# Patient Record
Sex: Female | Born: 1990 | Race: White | Hispanic: No | Marital: Single | Smoking: Never smoker
Health system: Southern US, Community
[De-identification: ages and names within clinical notes are randomized; demographics above are authoritative.]

## PROBLEM LIST (undated history)

## (undated) DIAGNOSIS — R011 Cardiac murmur, unspecified: Secondary | ICD-10-CM

## (undated) DIAGNOSIS — J45909 Unspecified asthma, uncomplicated: Secondary | ICD-10-CM

## (undated) DIAGNOSIS — J302 Other seasonal allergic rhinitis: Secondary | ICD-10-CM

## (undated) DIAGNOSIS — K219 Gastro-esophageal reflux disease without esophagitis: Secondary | ICD-10-CM

## (undated) DIAGNOSIS — K819 Cholecystitis, unspecified: Secondary | ICD-10-CM

## (undated) DIAGNOSIS — IMO0001 Reserved for inherently not codable concepts without codable children: Secondary | ICD-10-CM

## (undated) HISTORY — PX: EYE SURGERY: SHX253

---

## 2014-10-19 ENCOUNTER — Ambulatory Visit: Payer: Self-pay | Admitting: Family Medicine

## 2014-11-05 ENCOUNTER — Emergency Department: Payer: Self-pay | Admitting: Emergency Medicine

## 2014-11-05 LAB — CBC WITH DIFFERENTIAL/PLATELET
BASOS PCT: 1.1 %
Basophil #: 0.1 10*3/uL (ref 0.0–0.1)
EOS ABS: 0.4 10*3/uL (ref 0.0–0.7)
Eosinophil %: 3.9 %
HCT: 39.5 % (ref 35.0–47.0)
HGB: 12.7 g/dL (ref 12.0–16.0)
Lymphocyte #: 3.4 10*3/uL (ref 1.0–3.6)
Lymphocyte %: 34.3 %
MCH: 27.3 pg (ref 26.0–34.0)
MCHC: 32 g/dL (ref 32.0–36.0)
MCV: 85 fL (ref 80–100)
Monocyte #: 0.7 x10 3/mm (ref 0.2–0.9)
Monocyte %: 6.7 %
NEUTROS PCT: 54 %
Neutrophil #: 5.4 10*3/uL (ref 1.4–6.5)
Platelet: 262 10*3/uL (ref 150–440)
RBC: 4.63 10*6/uL (ref 3.80–5.20)
RDW: 16.5 % — AB (ref 11.5–14.5)
WBC: 9.9 10*3/uL (ref 3.6–11.0)

## 2014-11-05 LAB — URINALYSIS, COMPLETE
BILIRUBIN, UR: NEGATIVE
Blood: NEGATIVE
GLUCOSE, UR: NEGATIVE mg/dL (ref 0–75)
Ketone: NEGATIVE
NITRITE: NEGATIVE
Ph: 5 (ref 4.5–8.0)
Protein: 30
RBC,UR: <1 /HPF (ref 0–5)
Specific Gravity: 1.033 (ref 1.003–1.030)
WBC UR: 2 /HPF (ref 0–5)

## 2014-11-05 LAB — COMPREHENSIVE METABOLIC PANEL
ALK PHOS: 60 U/L
ALT: 18 U/L
AST: 19 U/L (ref 15–37)
Albumin: 3.6 g/dL (ref 3.4–5.0)
Anion Gap: 5 — ABNORMAL LOW (ref 7–16)
BILIRUBIN TOTAL: 0.3 mg/dL (ref 0.2–1.0)
BUN: 19 mg/dL — ABNORMAL HIGH (ref 7–18)
CALCIUM: 9 mg/dL (ref 8.5–10.1)
CHLORIDE: 111 mmol/L — AB (ref 98–107)
Co2: 24 mmol/L (ref 21–32)
Creatinine: 0.68 mg/dL (ref 0.60–1.30)
EGFR (African American): >60
Glucose: 92 mg/dL (ref 65–99)
OSMOLALITY: 281 (ref 275–301)
POTASSIUM: 4.4 mmol/L (ref 3.5–5.1)
Sodium: 140 mmol/L (ref 136–145)
Total Protein: 7.7 g/dL (ref 6.4–8.2)

## 2014-11-05 LAB — PREGNANCY, URINE: Pregnancy Test, Urine: NEGATIVE m[IU]/mL

## 2015-08-13 ENCOUNTER — Encounter: Payer: Self-pay | Admitting: *Deleted

## 2015-08-16 ENCOUNTER — Ambulatory Visit
Admission: RE | Admit: 2015-08-16 | Discharge: 2015-08-16 | Disposition: A | Discharge status: Home / Self Care | Payer: Medicaid Other | Source: Ambulatory Visit | Attending: Gastroenterology | Admitting: Gastroenterology

## 2015-08-16 ENCOUNTER — Encounter: Payer: Self-pay | Admitting: *Deleted

## 2015-08-16 ENCOUNTER — Encounter
Admission: RE | Disposition: A | Discharge status: Home / Self Care | Payer: Self-pay | Source: Ambulatory Visit | Attending: Gastroenterology

## 2015-08-16 ENCOUNTER — Ambulatory Visit: Payer: Medicaid Other | Admitting: Certified Registered Nurse Anesthetist

## 2015-08-16 DIAGNOSIS — Z8711 Personal history of peptic ulcer disease: Secondary | ICD-10-CM | POA: Insufficient documentation

## 2015-08-16 DIAGNOSIS — Z79899 Other long term (current) drug therapy: Secondary | ICD-10-CM | POA: Insufficient documentation

## 2015-08-16 DIAGNOSIS — R112 Nausea with vomiting, unspecified: Secondary | ICD-10-CM | POA: Insufficient documentation

## 2015-08-16 DIAGNOSIS — B9681 Helicobacter pylori [H. pylori] as the cause of diseases classified elsewhere: Secondary | ICD-10-CM | POA: Diagnosis not present

## 2015-08-16 DIAGNOSIS — Z7951 Long term (current) use of inhaled steroids: Secondary | ICD-10-CM | POA: Diagnosis not present

## 2015-08-16 DIAGNOSIS — J45909 Unspecified asthma, uncomplicated: Secondary | ICD-10-CM | POA: Diagnosis not present

## 2015-08-16 DIAGNOSIS — K219 Gastro-esophageal reflux disease without esophagitis: Secondary | ICD-10-CM | POA: Insufficient documentation

## 2015-08-16 DIAGNOSIS — K2950 Unspecified chronic gastritis without bleeding: Secondary | ICD-10-CM | POA: Insufficient documentation

## 2015-08-16 HISTORY — PX: ESOPHAGOGASTRODUODENOSCOPY (EGD) WITH PROPOFOL: SHX5813

## 2015-08-16 HISTORY — DX: Unspecified asthma, uncomplicated: J45.909

## 2015-08-16 LAB — POCT PREGNANCY, URINE: Preg Test, Ur: NEGATIVE

## 2015-08-16 SURGERY — ESOPHAGOGASTRODUODENOSCOPY (EGD) WITH PROPOFOL
Anesthesia: General

## 2015-08-16 MED ORDER — LIDOCAINE HCL (CARDIAC) 20 MG/ML IV SOLN
INTRAVENOUS | Status: DC | PRN
  Administered 2015-08-16: 100 mg via INTRAVENOUS

## 2015-08-16 MED ORDER — PROPOFOL 500 MG/50ML IV EMUL
INTRAVENOUS | Status: DC | PRN
  Administered 2015-08-16: 150 ug/kg/min via INTRAVENOUS

## 2015-08-16 MED ORDER — MIDAZOLAM HCL 2 MG/2ML IJ SOLN
INTRAMUSCULAR | Status: DC | PRN
  Administered 2015-08-16: 1 mg via INTRAVENOUS

## 2015-08-16 MED ORDER — FENTANYL CITRATE (PF) 100 MCG/2ML IJ SOLN
INTRAMUSCULAR | Status: DC | PRN
  Administered 2015-08-16: 50 ug via INTRAVENOUS

## 2015-08-16 MED ORDER — IPRATROPIUM-ALBUTEROL 0.5-2.5 (3) MG/3ML IN SOLN
RESPIRATORY_TRACT | Status: AC
  Filled 2015-08-16: qty 3

## 2015-08-16 MED ORDER — IPRATROPIUM-ALBUTEROL 0.5-2.5 (3) MG/3ML IN SOLN
3.0000 mL | Freq: Once | RESPIRATORY_TRACT | Status: AC
  Administered 2015-08-16: 3 mL via RESPIRATORY_TRACT

## 2015-08-16 MED ORDER — SODIUM CHLORIDE 0.9 % IV SOLN
INTRAVENOUS | Status: DC
  Administered 2015-08-16: 11:00:00 via INTRAVENOUS

## 2015-08-16 MED ORDER — PROPOFOL 10 MG/ML IV BOLUS
INTRAVENOUS | Status: DC | PRN
  Administered 2015-08-16: 30 mg via INTRAVENOUS

## 2015-08-16 MED ORDER — GLYCOPYRROLATE 0.2 MG/ML IJ SOLN
INTRAMUSCULAR | Status: DC | PRN
  Administered 2015-08-16: 0.2 mg via INTRAVENOUS

## 2015-08-16 NOTE — Anesthesia Procedure Notes (Signed)
Performed by: Lilliona Blakeney Pre-anesthesia Checklist: Patient identified, Emergency Drugs available, Suction available, Patient being monitored and Timeout performed Patient Re-evaluated:Patient Re-evaluated prior to inductionOxygen Delivery Method: Nasal cannula Intubation Type: IV induction       

## 2015-08-16 NOTE — Transfer of Care (Signed)
Immediate Anesthesia Transfer of Care Note  Patient: Rhonda Curry  Procedure(s) Performed: Procedure(s): ESOPHAGOGASTRODUODENOSCOPY (EGD) WITH PROPOFOL (N/A)  Patient Location: PACU  Anesthesia Type:General  Level of Consciousness: sedated  Airway & Oxygen Therapy: Patient Spontanous Breathing and Patient connected to nasal cannula oxygen  Post-op Assessment: Report given to RN and Post -op Vital signs reviewed and stable  Post vital signs: Reviewed and stable  Last Vitals:  Filed Vitals:   08/16/15 1022  BP: 140/91  Pulse: 90  Temp: 37.1 C  Resp: 10    Complications: No apparent anesthesia complications

## 2015-08-16 NOTE — Anesthesia Postprocedure Evaluation (Signed)
  Anesthesia Post-op Note  Patient: Rhonda Curry  Procedure(s) Performed: Procedure(s): ESOPHAGOGASTRODUODENOSCOPY (EGD) WITH PROPOFOL (N/A)  Anesthesia type:General  Patient location: PACU  Post pain: Pain level controlled  Post assessment: Post-op Vital signs reviewed, Patient's Cardiovascular Status Stable, Respiratory Function Stable, Patent Airway and No signs of Nausea or vomiting  Post vital signs: Reviewed and stable  Last Vitals:  Filed Vitals:   08/16/15 1220  BP: 114/78  Pulse: 60  Temp:   Resp: 15    Level of consciousness: awake, alert  and patient cooperative  Complications: No apparent anesthesia complications

## 2015-08-16 NOTE — Op Note (Signed)
[facility] Patient Name: Rhonda Curry Procedure Date: 08/16/2015 11:22 AM MRN: [id number] Account #: [id number] Date of Birth: 31-Dec-1990 Admit Type: Outpatient Age: 24 Room: [facility] ENDO ROOM 4 Gender: Female Note Status: Finalized Procedure:         Upper GI endoscopy Indications:       Epigastric abdominal pain, Dyspepsia Providers:         Christena Deem, MD Referring MD:      Charisse March (Referring MD) Medicines:         Monitored Anesthesia Care Complications:     No immediate complications. Procedure:         Pre-Anesthesia Assessment:                    - ASA Grade Assessment: II - A patient with mild systemic                     disease.                    After obtaining informed consent, the endoscope was passed                     under direct vision. Throughout the procedure, the                     patient's blood pressure, pulse, and oxygen saturations                     were monitored continuously. The Endoscope was introduced                     through the mouth, and advanced to the third part of                     duodenum. The upper GI endoscopy was accomplished without                     difficulty. The patient tolerated the procedure well. Findings:      The Z-line was variable. Biopsies were taken with a cold forceps for       histology.      Diffuse mild inflammation characterized by congestion (edema) and       erythema was found in the gastric body and in the gastric antrum.       Biopsies were taken with a cold forceps for histology.      patulous ge junction      The examined duodenum was normal. Impression:        - Z-line variable. Biopsied.                    - Bile gastritis. Biopsied.                    - Normal examined duodenum. Recommendation:    - Use Protonix (pantoprazole) 40 mg PO daily daily.                    - Use sucralfate tablets 1 gram PO QID daily.                    - Return  to GI clinic in 1 month.                    - Await pathology results.  Procedure Code(s): --- Professional ---                    [phone], Esophagogastroduodenoscopy, flexible, transoral;                     with biopsy, single or multiple Diagnosis Code(s): --- Professional ---                    530.89, Other specified disorders of esophagus                    535.40, Other specified gastritis, without mention of                     hemorrhage                    789.06, Abdominal pain, epigastric                    536.8, Dyspepsia and other specified disorders of function                     of stomach CPT copyright 2014 American [employer] Association. All rights reserved. The codes documented in this report are preliminary and upon coder review may  be revised to meet current compliance requirements. Christena Deem, MD 08/16/2015 11:44:37 AM This report has been signed electronically. Number of Addenda: 0 Note Initiated On: 08/16/2015 11:22 AM      [facility]

## 2015-08-16 NOTE — Anesthesia Preprocedure Evaluation (Signed)
Anesthesia Evaluation  Patient identified by MRN, date of birth, ID band Patient awake    Reviewed: Allergy & Precautions, H&P , NPO status , Patient's Chart, lab work & pertinent test results  History of Anesthesia Complications Negative for: history of anesthetic complications  Airway Mallampati: II  TM Distance: >3 FB Neck ROM: full    Dental  (+) Poor Dentition, Chipped, Missing   Pulmonary neg shortness of breath, asthma ,    Pulmonary exam normal breath sounds clear to auscultation       Cardiovascular Exercise Tolerance: Good (-) Past MI negative cardio ROS Normal cardiovascular exam Rhythm:regular Rate:Normal     Neuro/Psych negative neurological ROS  negative psych ROS   GI/Hepatic Neg liver ROS, GERD  Controlled,  Endo/Other  negative endocrine ROS  Renal/GU negative Renal ROS  negative genitourinary   Musculoskeletal negative musculoskeletal ROS (+)   Abdominal   Peds negative pediatric ROS (+)  Hematology negative hematology ROS (+)   Anesthesia Other Findings Past Medical History:   Asthma                                                      BMI    Body Mass Index   29.68 kg/m 2      Reproductive/Obstetrics negative OB ROS                             Anesthesia Physical Anesthesia Plan  ASA: III  Anesthesia Plan: General   Post-op Pain Management:    Induction:   Airway Management Planned:   Additional Equipment:   Intra-op Plan:   Post-operative Plan:   Informed Consent: I have reviewed the patients History and Physical, chart, labs and discussed the procedure including the risks, benefits and alternatives for the proposed anesthesia with the patient or authorized representative who has indicated his/her understanding and acceptance.   Dental Advisory Given  Plan Discussed with: Anesthesiologist, CRNA and Surgeon  Anesthesia Plan Comments:          Anesthesia Quick Evaluation

## 2015-08-16 NOTE — H&P (Signed)
Outpatient short stay form Pre-procedure 08/16/2015 11:23 AM Christena Deem MD  Primary Physician: Dr. Lanier Ensign  Reason for visit:  EGD  History of present illness:  Patient is a 24 year old female with a personal history of ulcer as well as NSAID use. She is presenting today for an EGD in regards to refractory reflux as well as epigastric pain and nausea. He used to take ibuprofen but had stopped that however continues with meloxicam daily.    Current facility-administered medications:  .  0.9 %  sodium chloride infusion, , Intravenous, Continuous, Christena Deem, MD, Last Rate: 20 mL/hr at 08/16/15 1052 .  ipratropium-albuterol (DUONEB) 0.5-2.5 (3) MG/3ML nebulizer solution, , , ,   Facility-Administered Medications Ordered in Other Encounters:  .  fentaNYL (SUBLIMAZE) injection, , , Anesthesia Intra-op, Malva Cogan, CRNA, 50 mcg at 08/16/15 1122 .  glycopyrrolate (ROBINUL) injection, , , Anesthesia Intra-op, Malva Cogan, CRNA, 0.2 mg at 08/16/15 1120 .  midazolam (VERSED) injection, , , Anesthesia Intra-op, Malva Cogan, CRNA, 1 mg at 08/16/15 1122  Prescriptions prior to admission  Medication Sig Dispense Refill Last Dose  . albuterol (PROVENTIL HFA;VENTOLIN HFA) 108 (90 BASE) MCG/ACT inhaler Inhale into the lungs every 6 (six) hours as needed for wheezing or shortness of breath.   Past Month at Unknown time  . beclomethasone (QVAR) 40 MCG/ACT inhaler Inhale into the lungs 2 (two) times daily.   Past Month at Unknown time  . cetirizine (ZYRTEC) 10 MG tablet Take 10 mg by mouth daily.   Past Week at Unknown time  . chlorhexidine (PERIDEX) 0.12 % solution Use as directed 15 mLs in the mouth or throat 2 (two) times daily.   08/15/2015 at Unknown time  . fluticasone (FLONASE) 50 MCG/ACT nasal spray Place 2 sprays into both nostrils daily.   Past Week at Unknown time  . meloxicam (MOBIC) 7.5 MG tablet Take 7.5 mg by mouth daily.   Past Week at Unknown time  .  ondansetron (ZOFRAN-ODT) 4 MG disintegrating tablet Take 4 mg by mouth every 8 (eight) hours as needed for nausea or vomiting.   Past Week at Unknown time  . pantoprazole (PROTONIX) 40 MG tablet Take 40 mg by mouth daily.   Past Week at Unknown time  . ranitidine (ZANTAC) 150 MG capsule Take 150 mg by mouth daily.   Past Week at Unknown time     Allergies  Allergen Reactions  . Acetaminophen      Past Medical History  Diagnosis Date  . Asthma     Review of systems:      Physical Exam    Heart and lungs: Regular rate and rhythm without rub or gallop, lungs are bilaterally clear    HEENT: Normocephalic atraumatic eyes are anicteric    Other:     Pertinant exam for procedure: Soft nontender nondistended bowel sounds positive normoactive    Planned proceedures: EGD and indicated procedures I have discussed the risks benefits and complications of procedures to include not limited to bleeding, infection, perforation and the risk of sedation and the patient wishes to proceed.    Christena Deem, MD Gastroenterology 08/16/2015  11:23 AM

## 2015-08-18 LAB — SURGICAL PATHOLOGY

## 2015-08-25 ENCOUNTER — Encounter: Payer: Self-pay | Admitting: Gastroenterology

## 2015-10-26 ENCOUNTER — Other Ambulatory Visit: Payer: Self-pay | Admitting: Gastroenterology

## 2015-10-26 DIAGNOSIS — R1084 Generalized abdominal pain: Secondary | ICD-10-CM

## 2015-11-05 ENCOUNTER — Ambulatory Visit
Admission: RE | Admit: 2015-11-05 | Discharge: 2015-11-05 | Disposition: A | Discharge status: Home / Self Care | Payer: Medicaid Other | Source: Ambulatory Visit | Attending: Gastroenterology | Admitting: Gastroenterology

## 2015-11-05 ENCOUNTER — Encounter
Admission: RE | Admit: 2015-11-05 | Discharge: 2015-11-05 | Disposition: A | Discharge status: Home / Self Care | Payer: Medicaid Other | Source: Ambulatory Visit | Attending: Gastroenterology | Admitting: Gastroenterology

## 2015-11-05 ENCOUNTER — Other Ambulatory Visit
Admission: RE | Admit: 2015-11-05 | Discharge: 2015-11-05 | Disposition: A | Discharge status: Home / Self Care | Payer: Medicaid Other | Source: Ambulatory Visit | Attending: Gastroenterology | Admitting: Gastroenterology

## 2015-11-05 DIAGNOSIS — R1084 Generalized abdominal pain: Secondary | ICD-10-CM

## 2015-11-05 DIAGNOSIS — R1011 Right upper quadrant pain: Secondary | ICD-10-CM | POA: Insufficient documentation

## 2015-11-05 LAB — PREGNANCY, URINE: PREG TEST UR: NEGATIVE

## 2015-11-05 MED ORDER — SINCALIDE 5 MCG IJ SOLR
0.0200 ug/kg | Freq: Once | INTRAMUSCULAR | Status: AC
  Administered 2015-11-05: 1.44 ug via INTRAVENOUS

## 2015-11-05 MED ORDER — TECHNETIUM TC 99M MEBROFENIN IV KIT
5.0000 | PACK | Freq: Once | INTRAVENOUS | Status: AC | PRN
  Administered 2015-11-05: 5.05 via INTRAVENOUS

## 2015-11-26 ENCOUNTER — Encounter
Admission: RE | Admit: 2015-11-26 | Discharge: 2015-11-26 | Disposition: A | Discharge status: Home / Self Care | Payer: Medicaid Other | Source: Ambulatory Visit | Attending: Surgery | Admitting: Surgery

## 2015-11-26 DIAGNOSIS — Z01812 Encounter for preprocedural laboratory examination: Secondary | ICD-10-CM | POA: Insufficient documentation

## 2015-11-26 DIAGNOSIS — R011 Cardiac murmur, unspecified: Secondary | ICD-10-CM

## 2015-11-26 HISTORY — DX: Gastro-esophageal reflux disease without esophagitis: K21.9

## 2015-11-26 HISTORY — DX: Cardiac murmur, unspecified: R01.1

## 2015-11-26 HISTORY — DX: Cholecystitis, unspecified: K81.9

## 2015-11-26 HISTORY — DX: Other seasonal allergic rhinitis: J30.2

## 2015-11-26 HISTORY — DX: Reserved for inherently not codable concepts without codable children: IMO0001

## 2015-11-26 LAB — CBC
HEMATOCRIT: 41.5 % (ref 35.0–47.0)
Hemoglobin: 13.5 g/dL (ref 12.0–16.0)
MCH: 27.6 pg (ref 26.0–34.0)
MCHC: 32.4 g/dL (ref 32.0–36.0)
MCV: 85 fL (ref 80.0–100.0)
Platelets: 243 10*3/uL (ref 150–440)
RBC: 4.88 MIL/uL (ref 3.80–5.20)
RDW: 16.6 % — ABNORMAL HIGH (ref 11.5–14.5)
WBC: 13 10*3/uL — ABNORMAL HIGH (ref 3.6–11.0)

## 2015-11-26 LAB — COMPREHENSIVE METABOLIC PANEL
ALBUMIN: 4.2 g/dL (ref 3.5–5.0)
ALK PHOS: 55 U/L (ref 38–126)
ALT: 15 U/L (ref 14–54)
ANION GAP: 7 (ref 5–15)
AST: 14 U/L — ABNORMAL LOW (ref 15–41)
BILIRUBIN TOTAL: 0.9 mg/dL (ref 0.3–1.2)
BUN: 16 mg/dL (ref 6–20)
CO2: 24 mmol/L (ref 22–32)
Calcium: 9 mg/dL (ref 8.9–10.3)
Chloride: 107 mmol/L (ref 101–111)
Creatinine, Ser: 0.63 mg/dL (ref 0.44–1.00)
GFR calc Af Amer: >60 mL/min (ref >60)
GFR calc non Af Amer: >60 mL/min (ref >60)
GLUCOSE: 92 mg/dL (ref 65–99)
Potassium: 3.6 mmol/L (ref 3.5–5.1)
Sodium: 138 mmol/L (ref 135–145)
Total Protein: 8 g/dL (ref 6.5–8.1)

## 2015-11-26 NOTE — Pre-Procedure Instructions (Signed)
Called Dr Henrene Hawking with abnormal EKG.  Called and faxed with request for cardiac clearance to Dr Lacretia Nicks. Katrinka Blazing.

## 2015-11-26 NOTE — Patient Instructions (Signed)
  Your procedure is scheduled on: 12/03/15 Fri  Report to Day Surgery. To find out your arrival time please call [phone] between 1PM - 3PM on 12/02/15 Thurs.  Remember: Instructions that are not followed completely may result in serious medical risk, up to and including death, or upon the discretion of your surgeon and anesthesiologist your surgery may need to be rescheduled.    __x_ 1. Do not eat food or drink liquids after midnight. No gum chewing or hard candies.     ____ 2. No Alcohol for 24 hours before or after surgery.   ____ 3. Bring all medications with you on the day of surgery if instructed.    _x__ 4. Notify your doctor if there is any change in your medical condition     (cold, fever, infections).     Do not wear jewelry, make-up, hairpins, clips or nail polish.  Do not wear lotions, powders, or perfumes. You may wear deodorant.  Do not shave 48 hours prior to surgery. Men may shave face and neck.  Do not bring valuables to the hospital.    [facility] is not responsible for any belongings or valuables.               Contacts, dentures or bridgework may not be worn into surgery.  Leave your suitcase in the car. After surgery it may be brought to your room.  For patients admitted to the hospital, discharge time is determined by your                treatment team.   Patients discharged the day of surgery will not be allowed to drive home.   Please read over the following fact sheets that you were given:      _x___ Take these medicines the morning of surgery with A SIP OF WATER:    1. albuterol (PROVENTIL HFA;VENTOLIN HFA) 108 (90 BASE) MCG/ACT inhaler  2. beclomethasone (QVAR) 40 MCG/ACT inhaler  3. pantoprazole (PROTONIX) 40 MG tablet  4.  5.  6.  ____ Fleet Enema (as directed)   __x__ Use CHG Soap as directed  ____ Use inhalers on the day of surgery  ____ Stop metformin 2 days prior to surgery    ____ Take 1/2 of usual insulin dose the night before  surgery and none on the morning of surgery.   ____ Stop Coumadin/Plavix/aspirin on   __x__ Stop Anti-inflammatories on today May take Tylenol as needed   ____ Stop supplements until after surgery.    ____ Bring C-Pap to the hospital.

## 2015-11-29 ENCOUNTER — Other Ambulatory Visit: Payer: Self-pay | Admitting: Cardiology

## 2015-11-29 DIAGNOSIS — R011 Cardiac murmur, unspecified: Secondary | ICD-10-CM

## 2015-11-30 ENCOUNTER — Ambulatory Visit
Admission: RE | Admit: 2015-11-30 | Discharge: 2015-11-30 | Disposition: A | Discharge status: Home / Self Care | Payer: Medicaid Other | Source: Ambulatory Visit | Attending: Cardiology | Admitting: Cardiology

## 2015-11-30 DIAGNOSIS — R011 Cardiac murmur, unspecified: Secondary | ICD-10-CM | POA: Diagnosis present

## 2015-11-30 NOTE — Pre-Procedure Instructions (Signed)
Seeing DR Lady Gary AND HAVING ECHO TODAY

## 2015-11-30 NOTE — Progress Notes (Signed)
*  PRELIMINARY RESULTS* Echocardiogram 2D Echocardiogram has been performed.  Rhonda Curry 11/30/2015, 10:34 AM

## 2015-12-01 NOTE — Pre-Procedure Instructions (Signed)
Cleared by Dr Lady Gary low risk 11/30/15. Echo in notes

## 2015-12-01 NOTE — Pre-Procedure Instructions (Signed)
[facility]  ECHO COMPLETE WO IMAGE ENHANCING AGENT  Order# [id number]   Reading physician: Lamar Blinks, MD  Ordering physician: Dalia Heading, MD  Study date: 11/30/15      PACS Images    Show images for ECHOCARDIOGRAM COMPLETE    Narrative            *[facility]*           [address]            [place], [place] [address]              [phone]  ------------------------------------------------------------------- Transthoracic Echocardiography  Patient:  Rhonda Curry, Conant MR #:    [id number] Study Date: 11/30/2015 Gender:   F Age:    25 Height:   154.9 cm Weight:   72.1 kg BSA:    1.79 m^2 Pt. Status: Room:  ATTENDING  Harold Hedge, MD ORDERING   Harold Hedge, MD SONOGRAPHER Cristela Blue RDCS PERFORMING  [facility]  cc:  ------------------------------------------------------------------- LV EF: 60% -  65%  ------------------------------------------------------------------- Indications:   Murmur 785.2.  ------------------------------------------------------------------- Study Conclusions  - Left ventricle: The cavity size was normal. Systolic function was normal. The estimated ejection fraction was in the range of 60% to 65%. - Aortic valve: Valve area (Vmax): 3 cm^2.  Transthoracic echocardiography. M-mode, complete 2D, spectral Doppler, and color Doppler. Birthdate: Patient birthdate: 12-17-1990. Age: Patient is 25 yr old. Sex: Gender: female. BMI: 30.1 kg/m^2. Patient status: Outpatient. Study date: Study date: 11/30/2015. Study time: 10:04 AM.  -------------------------------------------------------------------  ------------------------------------------------------------------- Left ventricle: The cavity size was normal. Systolic function was normal. The estimated ejection fraction was in the range of 60%  to 65%.  ------------------------------------------------------------------- Aortic valve:  Structurally normal valve.  Cusp separation was normal. Doppler: Transvalvular velocity was within the normal range. There was no stenosis. There was no regurgitation.  Peak velocity ratio of LVOT to aortic valve: 0.96. Valve area (Vmax): 3 cm^2. Indexed valve area (Vmax): 1.68 cm^2/m^2.  Peak gradient (S): 5 mm Hg.  ------------------------------------------------------------------- Aorta: The aorta was normal, not dilated, and non-diseased.  ------------------------------------------------------------------- Mitral valve:  Structurally normal valve.  Leaflet separation was normal. Doppler: Transvalvular velocity was within the normal range. There was no evidence for stenosis. There was trivial regurgitation.  Peak gradient (D): 3 mm Hg.  ------------------------------------------------------------------- Left atrium: The atrium was normal in size.  ------------------------------------------------------------------- Right ventricle: The cavity size was normal. Wall thickness was normal. Systolic function was normal.  ------------------------------------------------------------------- Tricuspid valve:  Structurally normal valve.  Leaflet separation was normal. Doppler: Transvalvular velocity was within the normal range. There was trivial regurgitation.  ------------------------------------------------------------------- Pulmonary artery:  Poorly visualized.  ------------------------------------------------------------------- Right atrium: The atrium was normal in size.  ------------------------------------------------------------------- Post procedure conclusions Ascending Aorta:  - The aorta was normal, not dilated, and non-diseased.  ------------------------------------------------------------------- Measurements  Left ventricle               Value     Reference LV ID, ED, PLAX chordal      (L)   41  mm    43 - 52 LV ID, ES, PLAX chordal          27.4 mm    23 - 38 LV fx shortening, PLAX chordal      33  %    >=29 LV PW thickness, ED            9.27 mm    --------- IVS/LV PW ratio, ED  0.83      <=1.3 LV e&', lateral              15.2 cm/s   --------- LV E/e&', lateral             5.65      --------- LV e&', medial               10.9 cm/s   --------- LV E/e&', medial              7.88      --------- LV e&', average              13.05 cm/s   --------- LV E/e&', average             6.58      ---------  Ventricular septum            Value     Reference IVS thickness, ED             7.68 mm    ---------  LVOT                   Value     Reference LVOT ID, S                20  mm    --------- LVOT area                 3.14 cm^2   --------- LVOT peak velocity, S           107  cm/s   ---------  Aortic valve               Value     Reference Aortic valve peak velocity, S       112  cm/s   --------- Aortic peak gradient, S          5   mm Hg  --------- Velocity ratio, peak, LVOT/AV       0.96      --------- Aortic valve area, peak velocity     3   cm^2   --------- Aortic valve area/bsa, peak        1.68 cm^2/m^2 --------- velocity  Aorta                   Value     Reference Aortic root ID, ED            21  mm    ---------  Left atrium                Value     Reference LA ID, A-P, ES              26  mm    --------- LA ID/bsa, A-P               1.45 cm/m^2  <=2.2 LA volume, S               21  ml    --------- LA volume/bsa, S             11.7 ml/m^2  --------- LA volume, ES, 1-p A4C          16.9 ml    --------- LA volume/bsa, ES, 1-p A4C        9.5  ml/m^2  --------- LA volume, ES, 1-p A2C          24.7 ml    --------- LA volume/bsa,  ES, 1-p A2C        13.8 ml/m^2  ---------  Mitral valve               Value     Reference Mitral E-wave peak velocity        85.9 cm/s   --------- Mitral A-wave peak velocity        54.8 cm/s   --------- Mitral deceleration time         195  ms    150 - 230 Mitral peak gradient, D          3   mm Hg  --------- Mitral E/A ratio, peak          1.6      ---------  Right ventricle              Value     Reference RV ID, ED, PLAX              26.2 mm    19 - 38 TAPSE                   18.4 mm    ---------  Pulmonic valve              Value     Reference Pulmonic valve peak velocity, S      89.7 cm/s   ---------  Legend: (L) and (H) mark values outside specified reference range.  ------------------------------------------------------------------- Prepared and Electronically Authenticated by  Arnoldo Hooker, MD 2017-01-24T13:15:39    Congenital Measurements       Z-score normal range: +/-2   BSA formula: Ripley Fraise Documents:    There are no order-level documents.    Encounter-Level Documents - 11/30/2015:      Electronic signature on 11/30/2015 9:35 AM    Signed    Electronically signed by Lamar Blinks, MD on 11/30/15 at 1315 EST    Printable Result Report    Result Report    ECHOCARDIOGRAM COMPLETE (Order [id number])  Echocardiography  Order: [id number]   Released By: Raeford Razor  Authorizing: Dalia Heading, MD   Date: 11/30/2015  Department: [facility]       Procedure Abnormality Status    ECHOCARDIOGRAM COMPLETE      Order Information     Order Date/Time Release Date/Time Start Date/Time End Date/Time    11/30/15 09:35 AM 11/30/15 09:35 AM 11/30/15 09:35 AM 11/30/2015      Order Details     Frequency Duration Priority Order Class    As needed 1 occurrence Routine Ancillary Performed      Acc#    [id number]      Order Questions     Question Answer Comment    Where should this test be performed [facility]     Complete or Limited study? Complete     With Image Enhancing Agent or without Image Enhancing Agent? With Image Enhancing Agent     Note:  Select Without Image Enhancing Agent only if contraindicated.    Expected Date: ASAP     Reason for exam-Echo Murmur 785.2 / R01.1       Associated Diagnoses       ICD-9-CM ICD-10-CM    Murmur    785.2 R01.1      Order History  Inpatient    Date/Time Action Taken User Additional Information    0000 Result Margreta Journey In process    11/30/15  0935 Release Raeford Razor [id number]    11/30/15 1315 Result Rad Results In Interface Final      Order Requisition     ECHOCARDIOGRAM COMPLETE (Order #[id number]) on 11/30/15     Collection Information     Collected: 11/30/2015 10:04 AM   Resulting Agency: [facility]       View SmartLink Info     ECHOCARDIOGRAM COMPLETE (Order #[id number]) on 11/30/15

## 2015-12-03 ENCOUNTER — Ambulatory Visit: Payer: Medicaid Other | Admitting: Anesthesiology

## 2015-12-03 ENCOUNTER — Ambulatory Visit: Payer: Medicaid Other

## 2015-12-03 ENCOUNTER — Encounter
Admission: RE | Disposition: A | Discharge status: Home / Self Care | Payer: Self-pay | Source: Ambulatory Visit | Attending: Surgery

## 2015-12-03 ENCOUNTER — Ambulatory Visit
Admission: RE | Admit: 2015-12-03 | Discharge: 2015-12-03 | Disposition: A | Discharge status: Home / Self Care | Payer: Medicaid Other | Source: Ambulatory Visit | Attending: Surgery | Admitting: Surgery

## 2015-12-03 ENCOUNTER — Encounter: Payer: Self-pay | Admitting: *Deleted

## 2015-12-03 DIAGNOSIS — Z79899 Other long term (current) drug therapy: Secondary | ICD-10-CM | POA: Insufficient documentation

## 2015-12-03 DIAGNOSIS — J45909 Unspecified asthma, uncomplicated: Secondary | ICD-10-CM | POA: Diagnosis not present

## 2015-12-03 DIAGNOSIS — Z8719 Personal history of other diseases of the digestive system: Secondary | ICD-10-CM | POA: Diagnosis not present

## 2015-12-03 DIAGNOSIS — K8020 Calculus of gallbladder without cholecystitis without obstruction: Secondary | ICD-10-CM

## 2015-12-03 DIAGNOSIS — Z7951 Long term (current) use of inhaled steroids: Secondary | ICD-10-CM | POA: Diagnosis not present

## 2015-12-03 DIAGNOSIS — Z825 Family history of asthma and other chronic lower respiratory diseases: Secondary | ICD-10-CM | POA: Insufficient documentation

## 2015-12-03 DIAGNOSIS — Z791 Long term (current) use of non-steroidal anti-inflammatories (NSAID): Secondary | ICD-10-CM | POA: Insufficient documentation

## 2015-12-03 DIAGNOSIS — K811 Chronic cholecystitis: Secondary | ICD-10-CM | POA: Insufficient documentation

## 2015-12-03 HISTORY — PX: CHOLECYSTECTOMY: SHX55

## 2015-12-03 LAB — POCT PREGNANCY, URINE: PREG TEST UR: NEGATIVE

## 2015-12-03 SURGERY — LAPAROSCOPIC CHOLECYSTECTOMY
Anesthesia: General | Wound class: Clean Contaminated

## 2015-12-03 MED ORDER — OXYCODONE HCL 5 MG PO TABS
5.0000 mg | ORAL_TABLET | ORAL | Status: DC | PRN
  Administered 2015-12-03: 5 mg via ORAL
  Filled 2015-12-03: qty 1

## 2015-12-03 MED ORDER — OXYCODONE HCL 5 MG PO TABS
ORAL_TABLET | ORAL | Status: AC
  Filled 2015-12-03: qty 1

## 2015-12-03 MED ORDER — OXYCODONE HCL 5 MG PO TABS: 5.0000 mg | ORAL_TABLET | ORAL | Status: AC | PRN

## 2015-12-03 MED ORDER — SUCCINYLCHOLINE CHLORIDE 20 MG/ML IJ SOLN
INTRAMUSCULAR | Status: DC | PRN
  Administered 2015-12-03: 100 mg via INTRAVENOUS

## 2015-12-03 MED ORDER — HEPARIN SODIUM (PORCINE) 5000 UNIT/ML IJ SOLN
INTRAMUSCULAR | Status: AC
  Filled 2015-12-03: qty 1

## 2015-12-03 MED ORDER — DEXAMETHASONE SODIUM PHOSPHATE 10 MG/ML IJ SOLN
INTRAMUSCULAR | Status: DC | PRN
  Administered 2015-12-03: 10 mg via INTRAVENOUS

## 2015-12-03 MED ORDER — LIDOCAINE HCL (CARDIAC) 20 MG/ML IV SOLN
INTRAVENOUS | Status: DC | PRN
  Administered 2015-12-03: 100 mg via INTRAVENOUS

## 2015-12-03 MED ORDER — ONDANSETRON HCL 4 MG/2ML IJ SOLN: 4.0000 mg | Freq: Once | INTRAMUSCULAR | Status: DC | PRN

## 2015-12-03 MED ORDER — SUGAMMADEX SODIUM 200 MG/2ML IV SOLN
INTRAVENOUS | Status: DC | PRN
  Administered 2015-12-03: 144.2 mg via INTRAVENOUS

## 2015-12-03 MED ORDER — FENTANYL CITRATE (PF) 100 MCG/2ML IJ SOLN
25.0000 ug | INTRAMUSCULAR | Status: AC | PRN
  Administered 2015-12-03 (×6): 25 ug via INTRAVENOUS

## 2015-12-03 MED ORDER — LACTATED RINGERS IV SOLN
INTRAVENOUS | Status: DC
  Administered 2015-12-03 (×2): via INTRAVENOUS

## 2015-12-03 MED ORDER — FENTANYL CITRATE (PF) 100 MCG/2ML IJ SOLN
INTRAMUSCULAR | Status: AC
  Administered 2015-12-03: 25 ug via INTRAVENOUS
  Filled 2015-12-03: qty 2

## 2015-12-03 MED ORDER — PROPOFOL 10 MG/ML IV BOLUS
INTRAVENOUS | Status: DC | PRN
  Administered 2015-12-03: 150 mg via INTRAVENOUS

## 2015-12-03 MED ORDER — FENTANYL CITRATE (PF) 100 MCG/2ML IJ SOLN
INTRAMUSCULAR | Status: DC | PRN
  Administered 2015-12-03: 50 ug via INTRAVENOUS
  Administered 2015-12-03: 100 ug via INTRAVENOUS

## 2015-12-03 MED ORDER — BUPIVACAINE-EPINEPHRINE (PF) 0.5% -1:200000 IJ SOLN
INTRAMUSCULAR | Status: AC
  Filled 2015-12-03: qty 30

## 2015-12-03 MED ORDER — ONDANSETRON HCL 4 MG/2ML IJ SOLN
INTRAMUSCULAR | Status: DC | PRN
  Administered 2015-12-03: 4 mg via INTRAVENOUS

## 2015-12-03 MED ORDER — ROCURONIUM BROMIDE 100 MG/10ML IV SOLN
INTRAVENOUS | Status: DC | PRN
  Administered 2015-12-03: 40 mg via INTRAVENOUS
  Administered 2015-12-03: 10 mg via INTRAVENOUS

## 2015-12-03 MED ORDER — BUPIVACAINE-EPINEPHRINE (PF) 0.5% -1:200000 IJ SOLN
INTRAMUSCULAR | Status: DC | PRN
  Administered 2015-12-03: 13 mL

## 2015-12-03 MED ORDER — MIDAZOLAM HCL 2 MG/2ML IJ SOLN
INTRAMUSCULAR | Status: DC | PRN
  Administered 2015-12-03: 2 mg via INTRAVENOUS

## 2015-12-03 SURGICAL SUPPLY — 37 items
APPLIER CLIP ROT 10 11.4 M/L (STAPLE) ×4
CANISTER SUCT 1200ML W/VALVE (MISCELLANEOUS) ×4 IMPLANT
CANNULA DILATOR 10 W/SLV (CANNULA) ×3 IMPLANT
CANNULA DILATOR 10MM W/SLV (CANNULA) ×1
CATH REDDICK CHOLANGI 4FR 50CM (CATHETERS) ×4 IMPLANT
CHLORAPREP W/TINT 26ML (MISCELLANEOUS) ×4 IMPLANT
CLIP APPLIE ROT 10 11.4 M/L (STAPLE) ×2 IMPLANT
CLOSURE WOUND 1/2 X4 (GAUZE/BANDAGES/DRESSINGS) ×1
DRAPE SHEET LG 3/4 BI-LAMINATE (DRAPES) ×4 IMPLANT
ELECT REM PT RETURN 9FT ADLT (ELECTROSURGICAL) ×4
ELECTRODE REM PT RTRN 9FT ADLT (ELECTROSURGICAL) ×2 IMPLANT
GAUZE SPONGE 4X4 12PLY STRL (GAUZE/BANDAGES/DRESSINGS) ×4 IMPLANT
GLOVE BIO SURGEON STRL SZ7.5 (GLOVE) ×24 IMPLANT
GOWN STRL REUS W/ TWL LRG LVL3 (GOWN DISPOSABLE) ×8 IMPLANT
GOWN STRL REUS W/TWL LRG LVL3 (GOWN DISPOSABLE) ×8
IRRIGATION STRYKERFLOW (MISCELLANEOUS) ×2 IMPLANT
IRRIGATOR STRYKERFLOW (MISCELLANEOUS) ×4
IV NS 1000ML (IV SOLUTION) ×2
IV NS 1000ML BAXH (IV SOLUTION) ×2 IMPLANT
KIT RM TURNOVER STRD PROC AR (KITS) ×4 IMPLANT
LABEL OR SOLS (LABEL) ×4 IMPLANT
NDL INSUFF ACCESS 14 VERSASTEP (NEEDLE) ×4 IMPLANT
NEEDLE FILTER BLUNT 18X 1/2SAF (NEEDLE) ×2
NEEDLE FILTER BLUNT 18X1 1/2 (NEEDLE) ×2 IMPLANT
NS IRRIG 500ML POUR BTL (IV SOLUTION) ×4 IMPLANT
PACK LAP CHOLECYSTECTOMY (MISCELLANEOUS) ×4 IMPLANT
SCISSORS METZENBAUM CVD 33 (INSTRUMENTS) ×4 IMPLANT
SEAL FOR SCOPE WARMER C3101 (MISCELLANEOUS) IMPLANT
SLEEVE ENDOPATH XCEL 5M (ENDOMECHANICALS) ×4 IMPLANT
STRIP CLOSURE SKIN 1/2X4 (GAUZE/BANDAGES/DRESSINGS) ×3 IMPLANT
SUT CHROMIC 5 0 RB 1 27 (SUTURE) ×4 IMPLANT
SUT VIC AB 0 CT2 27 (SUTURE) IMPLANT
SYR 3ML LL SCALE MARK (SYRINGE) ×4 IMPLANT
TROCAR XCEL NON-BLD 11X100MML (ENDOMECHANICALS) ×4 IMPLANT
TROCAR XCEL NON-BLD 5MMX100MML (ENDOMECHANICALS) ×4 IMPLANT
TUBING INSUFFLATOR HI FLOW (MISCELLANEOUS) ×4 IMPLANT
WATER STERILE IRR 1000ML POUR (IV SOLUTION) ×4 IMPLANT

## 2015-12-03 NOTE — Anesthesia Procedure Notes (Signed)
Procedure Name: Intubation Date/Time: 12/03/2015 3:32 PM Performed by: Junious Silk Pre-anesthesia Checklist: Patient identified, Patient being monitored, Timeout performed, Emergency Drugs available and Suction available Patient Re-evaluated:Patient Re-evaluated prior to inductionOxygen Delivery Method: Circle system utilized Preoxygenation: Pre-oxygenation with 100% oxygen Intubation Type: IV induction Ventilation: Mask ventilation without difficulty Laryngoscope Size: Mac and 3 Grade View: Grade I Tube type: Oral Tube size: 7.0 mm Number of attempts: 1 Airway Equipment and Method: Stylet Placement Confirmation: ETT inserted through vocal cords under direct vision,  positive ETCO2 and breath sounds checked- equal and bilateral Secured at: 21 cm Tube secured with: Tape Dental Injury: Teeth and Oropharynx as per pre-operative assessment

## 2015-12-03 NOTE — Transfer of Care (Signed)
Immediate Anesthesia Transfer of Care Note  Patient: Rhonda Curry  Procedure(s) Performed: Procedure(s): LAPAROSCOPIC CHOLECYSTECTOMY  Patient Location: PACU  Anesthesia Type:General  Level of Consciousness: sedated  Airway & Oxygen Therapy: Patient Spontanous Breathing and Patient connected to face mask oxygen  Post-op Assessment: Report given to RN and Post -op Vital signs reviewed and stable  Post vital signs: Reviewed and stable  Last Vitals:  Filed Vitals:   12/03/15 1345 12/03/15 1634  BP: 130/82 111/70  Pulse: 66 59  Temp: 36.6 C 36.3 C  Resp: 16 16    Complications: No apparent anesthesia complications

## 2015-12-03 NOTE — Anesthesia Preprocedure Evaluation (Signed)
Anesthesia Evaluation  Patient identified by MRN, date of birth, ID band Patient awake    Reviewed: Allergy & Precautions, NPO status , Patient's Chart, lab work & pertinent test results  History of Anesthesia Complications Negative for: history of anesthetic complications  Airway Mallampati: III       Dental   Pulmonary asthma ,           Cardiovascular negative cardio ROS  + Valvular Problems/Murmurs (murmur hx)      Neuro/Psych negative neurological ROS     GI/Hepatic Neg liver ROS, GERD  Medicated and Poorly Controlled,  Endo/Other  negative endocrine ROS  Renal/GU negative Renal ROS     Musculoskeletal   Abdominal   Peds  Hematology   Anesthesia Other Findings   Reproductive/Obstetrics                             Anesthesia Physical Anesthesia Plan  ASA: II  Anesthesia Plan: General   Post-op Pain Management:    Induction: Intravenous and Rapid sequence  Airway Management Planned:   Additional Equipment:   Intra-op Plan:   Post-operative Plan:   Informed Consent: I have reviewed the patients History and Physical, chart, labs and discussed the procedure including the risks, benefits and alternatives for the proposed anesthesia with the patient or authorized representative who has indicated his/her understanding and acceptance.     Plan Discussed with:   Anesthesia Plan Comments:         Anesthesia Quick Evaluation

## 2015-12-03 NOTE — Op Note (Signed)
OPERATIVE REPORT  PREOPERATIVE DIAGNOSIS:  Chronic cholecystitis   POSTOPERATIVE DIAGNOSIS: Chronic cholecystitis   PROCEDURE: Laparoscopic cholecystectomy   ANESTHESIA: General  SURGEON: Renda Rolls M.D.  INDICATIONS: She has a history of right upper quadrant pain and had abnormally low gallbladder ejection fraction of 34% with reproduction of her pain with injection of cholecystokinin.  With the patient on the operating table in the supine position under general endotracheal anesthesia the abdomen was prepared with ChloraPrep solution and draped in a sterile manner. A short incision was made in the inferior aspect of the umbilicus and carried down to the deep fascia which was grasped with a laryngeal hook. A Veress needle was inserted aspirated and irrigated with a saline solution. The peritoneal cavity was insufflated with carbon dioxide. The Veress needle was removed. The 10 mm cannula was inserted. The 10 mm 0 laparoscope was inserted to view the peritoneal cavity. Another incision was made in the epigastrium slightly to the right of the midline to introduce an 11 mm cannula. 2 incisions were made in the lateral aspect of the right upper quadrant to introduce 2   5 mm cannulas. Initial inspection revealed a smooth surface of the liver. The gallbladder was retracted towards the right shoulder.   The gallbladder neck was retracted inferiorly and laterally.  The porta hepatis was identified. The gallbladder was mobilized with incision of the visceral peritoneum. The cystic duct was dissected free from surrounding structures. The cystic artery was dissected free from surrounding structures. A critical view of safety was demonstrated  An Endo Clip was placed across the cystic duct adjacent to the gallbladder neck. An incision was made in the cystic duct to introduce a Reddick catheter. The catheter would not thread into the cystic duct and therefore a cholangiogram was not done. The Reddick catheter  was removed. The cystic duct was doubly ligated with endoclips and divided. The cystic artery was controlled with double endoclips and divided. The gallbladder was dissected free from the liver with use of hook and cautery and blunt dissection. Bleeding was minimal and hemostasis was intact. The gallbladder was delivered up through the infraumbilical incision and submitted in formalin for routine pathology. The cannulas were removed. There was some minimal bleeding from the incisions. Several small bleeding points are cauterized. The subcutaneous tissues for all incisions were infiltrated with half percent Sensorcaine with epinephrine. The skin incisions were closed with interrupted 5-0 chromic subcutaneous suture benzoin and Steri-Strips. Gauze dressings were applied with paper tape.  The patient appeared to be in satisfactory condition and was prepared for transfer to the recovery room  Renda Rolls M.D.

## 2015-12-03 NOTE — Discharge Instructions (Addendum)
Take ibuprofen 200 mg  2 tablets 3 times a day with meals if needed for minor pain.  Take oxycodone 5 mg 1 tablet each 4 hours if needed for moderate pain.  Remove dressings on Saturday. May shower Sunday.  Avoid straining and heavy lifting for 1 week after surgery.  Laparoscopic Cholecystectomy, Care After Refer to this sheet in the next few weeks. These instructions provide you with information about caring for yourself after your procedure. Your health care provider may also give you more specific instructions. Your treatment has been planned according to current medical practices, but problems sometimes occur. Call your health care provider if you have any problems or questions after your procedure. WHAT TO EXPECT AFTER THE PROCEDURE After your procedure, it is common to have:  Pain at your incision sites. You will be given pain medicines to control your pain.  Mild nausea or vomiting. This should improve after the first 24 hours.  Bloating and possible shoulder pain from the gas that was used during the procedure. This will improve after the first 24 hours. HOME CARE INSTRUCTIONS Incision Care  Follow instructions from your health care provider about how to take care of your incisions. Make sure you:  Wash your hands with soap and water before you change your bandage (dressing). If soap and water are not available, use hand sanitizer.  Change your dressing as told by your health care provider.  Leave stitches (sutures), skin glue, or adhesive strips in place. These skin closures may need to be in place for 2 weeks or longer. If adhesive strip edges start to loosen and curl up, you may trim the loose edges. Do not remove adhesive strips completely unless your health care provider tells you to do that.  Do not take baths, swim, or use a hot tub until your health care provider approves. Ask your health care provider if you can take showers. You may only be allowed to take sponge baths  for bathing. General Instructions  Take over-the-counter and prescription medicines only as told by your health care provider.  Do not drive or operate heavy machinery while taking prescription pain medicine.  Return to your normal diet as told by your health care provider.  Do not lift anything that is heavier than 10 lb (4.5 kg).  Do not play contact sports for one week or until your health care provider approves. SEEK MEDICAL CARE IF:   You have redness, swelling, or pain at the site of your incision.  You have fluid, blood, or pus coming from your incision.  You notice a bad smell coming from your incision area.  Your surgical incisions break open.  You have a fever. SEEK IMMEDIATE MEDICAL CARE IF:  You develop a rash.  You have difficulty breathing.  You have chest pain.  You have increasing pain in your shoulders (shoulder strap areas).  You faint or have dizzy episodes while you are standing.  You have severe pain in your abdomen.  You have nausea or vomiting that lasts for more than one day.   This information is not intended to replace advice given to you by your health care provider. Make sure you discuss any questions you have with your health care provider.   Document Released: 10/23/2005 Document Revised: 07/14/2015 Document Reviewed: 06/04/2013 Elsevier Interactive Patient Education [employer]

## 2015-12-03 NOTE — OR Nursing (Signed)
Dr. Katrinka Blazing into see pt and mom.  OK for discharge.

## 2015-12-03 NOTE — Anesthesia Postprocedure Evaluation (Signed)
Anesthesia Post Note  Patient: Rhonda Curry  Procedure(s) Performed: Procedure(s): LAPAROSCOPIC CHOLECYSTECTOMY  Patient location during evaluation: PACU Anesthesia Type: General Level of consciousness: awake and alert Pain management: pain level controlled Vital Signs Assessment: post-procedure vital signs reviewed and stable Respiratory status: spontaneous breathing, nonlabored ventilation, respiratory function stable and patient connected to nasal cannula oxygen Cardiovascular status: blood pressure returned to baseline and stable Postop Assessment: no signs of nausea or vomiting Anesthetic complications: no    Last Vitals:  Filed Vitals:   12/03/15 1756 12/03/15 1816  BP: 124/81 123/87  Pulse: 63 68  Temp:    Resp: 14 18    Last Pain:  Filed Vitals:   12/03/15 1817  PainSc: 5                  Starlette Thurow S

## 2015-12-03 NOTE — H&P (Signed)
  She reports no change in condition since the day of the office examination.  I discussed the plan for laparoscopic cholecystectomy.

## 2015-12-06 ENCOUNTER — Encounter: Payer: Self-pay | Admitting: Surgery

## 2015-12-07 LAB — SURGICAL PATHOLOGY

## 2016-02-23 ENCOUNTER — Other Ambulatory Visit: Payer: Self-pay | Admitting: Gastroenterology

## 2016-02-23 DIAGNOSIS — R1032 Left lower quadrant pain: Secondary | ICD-10-CM

## 2016-02-23 DIAGNOSIS — R1031 Right lower quadrant pain: Secondary | ICD-10-CM

## 2016-02-25 ENCOUNTER — Ambulatory Visit
Admission: RE | Admit: 2016-02-25 | Discharge: 2016-02-25 | Disposition: A | Discharge status: Home / Self Care | Payer: Medicaid Other | Source: Ambulatory Visit | Attending: Gastroenterology | Admitting: Gastroenterology

## 2016-02-25 DIAGNOSIS — R1032 Left lower quadrant pain: Secondary | ICD-10-CM

## 2016-02-25 DIAGNOSIS — K529 Noninfective gastroenteritis and colitis, unspecified: Secondary | ICD-10-CM | POA: Diagnosis present

## 2016-02-25 DIAGNOSIS — R1031 Right lower quadrant pain: Secondary | ICD-10-CM | POA: Insufficient documentation

## 2016-02-25 DIAGNOSIS — Z9049 Acquired absence of other specified parts of digestive tract: Secondary | ICD-10-CM | POA: Diagnosis not present

## 2016-02-25 MED ORDER — IOHEXOL 350 MG/ML SOLN
125.0000 mL | Freq: Once | INTRAVENOUS | Status: AC | PRN
  Administered 2016-02-25: 125 mL via INTRAVENOUS

## 2016-09-12 IMAGING — NM NM HEPATO W/GB/PHARM/[PERSON_NAME]
3 series · 18 of 18 positions shown · non-contrast
Comparison: Ultrasound 11/05/2015.

CLINICAL DATA: General abdominal pain, nausea, vomiting

EXAM:
NUCLEAR MEDICINE HEPATOBILIARY IMAGING WITH GALLBLADDER EF
TECHNIQUE: Sequential images of the abdomen were obtained [DATE] minutes
following intravenous administration of radiopharmaceutical. After
slow intravenous infusion of 1.44 micrograms Cholecystokinin,
gallbladder ejection fraction was determined.
RADIOPHARMACEUTICALS:  5.1 mCi 4c-AAm Choletec IV

[Series 1000: hepatobiliary dynamic · 9.59mm/px · 6 of 60 frames shown]
[frame 6/60]
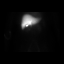
[frame 16/60]
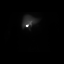
[frame 26/60]
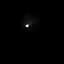
[frame 36/60]
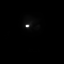
[frame 46/60]
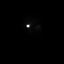
[frame 56/60]
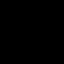

[Series 1000: gallbladder ef dynamic · 4.80mm/px · 6 of 120 frames shown]
[frame 11/120]
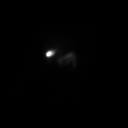
[frame 31/120]
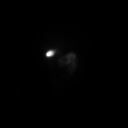
[frame 51/120]
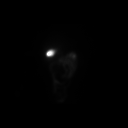
[frame 71/120]
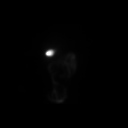
[frame 91/120]
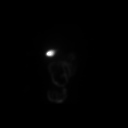
[frame 111/120]
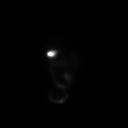

[Series 1000: gallbladder ef dynamic (results) · 4.80mm/px · 6 of 120 frames shown]
[frame 11/120]
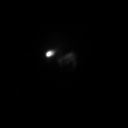
[frame 31/120]
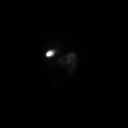
[frame 51/120]
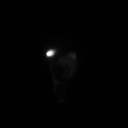
[frame 71/120]
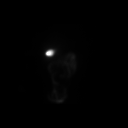
[frame 91/120]
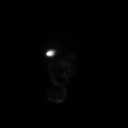
[frame 111/120]
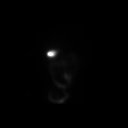

[18 of 18 positions shown; findings below may reference images not displayed]

FINDINGS: Prompt uptake and excretion of radiotracer by the liver. No evidence
of cystic or common bile duct obstruction. Gallbladder ejection
fraction is 34%. At 60 min, normal ejection fraction is greater than
40%.
IMPRESSION: Mildly diminished gallbladder ejection fraction. No evidence of
biliary obstruction.
